# Patient Record
Sex: Female | Born: 1975 | Race: White | Hispanic: No | Marital: Married | Smoking: Never smoker
Health system: Southern US, Community
[De-identification: ages and names within clinical notes are randomized; demographics above are authoritative.]

## PROBLEM LIST (undated history)

## (undated) DIAGNOSIS — E039 Hypothyroidism, unspecified: Secondary | ICD-10-CM

## (undated) DIAGNOSIS — E785 Hyperlipidemia, unspecified: Secondary | ICD-10-CM

## (undated) HISTORY — PX: ABDOMINAL HYSTERECTOMY: SHX81

## (undated) HISTORY — DX: Hyperlipidemia, unspecified: E78.5

## (undated) HISTORY — DX: Hypothyroidism, unspecified: E03.9

---

## 2011-01-19 ENCOUNTER — Ambulatory Visit: Payer: Self-pay

## 2012-01-24 ENCOUNTER — Ambulatory Visit: Payer: Self-pay | Admitting: General Practice

## 2015-04-15 ENCOUNTER — Other Ambulatory Visit: Payer: Self-pay | Admitting: Medical

## 2015-04-15 DIAGNOSIS — E039 Hypothyroidism, unspecified: Secondary | ICD-10-CM

## 2015-04-20 ENCOUNTER — Ambulatory Visit
Admission: RE | Admit: 2015-04-20 | Discharge: 2015-04-20 | Disposition: A | Discharge status: Home / Self Care | Payer: BLUE CROSS/BLUE SHIELD | Source: Ambulatory Visit | Attending: Medical | Admitting: Medical

## 2015-04-20 ENCOUNTER — Ambulatory Visit: Payer: BLUE CROSS/BLUE SHIELD

## 2015-04-20 DIAGNOSIS — R5383 Other fatigue: Secondary | ICD-10-CM | POA: Diagnosis present

## 2015-04-20 DIAGNOSIS — E039 Hypothyroidism, unspecified: Secondary | ICD-10-CM

## 2017-04-05 ENCOUNTER — Other Ambulatory Visit: Payer: Self-pay

## 2017-04-05 DIAGNOSIS — E559 Vitamin D deficiency, unspecified: Secondary | ICD-10-CM

## 2017-04-05 DIAGNOSIS — E7800 Pure hypercholesterolemia, unspecified: Secondary | ICD-10-CM

## 2017-04-05 DIAGNOSIS — E039 Hypothyroidism, unspecified: Secondary | ICD-10-CM

## 2017-04-06 LAB — VITAMIN D 25 HYDROXY (VIT D DEFICIENCY, FRACTURES): Vit D, 25-Hydroxy: 25.1 ng/mL — ABNORMAL LOW (ref 30.0–100.0)

## 2017-04-06 LAB — LIPID PANEL
CHOL/HDL RATIO: 4.2 ratio (ref 0.0–4.4)
Cholesterol, Total: 219 mg/dL — ABNORMAL HIGH (ref 100–199)
HDL: 52 mg/dL (ref >39)
LDL Calculated: 143 mg/dL — ABNORMAL HIGH (ref 0–99)
Triglycerides: 121 mg/dL (ref 0–149)
VLDL Cholesterol Cal: 24 mg/dL (ref 5–40)

## 2017-04-06 LAB — TSH: TSH: 3.22 u[IU]/mL (ref 0.450–4.500)

## 2017-05-11 ENCOUNTER — Other Ambulatory Visit: Payer: Self-pay | Admitting: Obstetrics and Gynecology

## 2017-05-11 DIAGNOSIS — Z1231 Encounter for screening mammogram for malignant neoplasm of breast: Secondary | ICD-10-CM

## 2017-05-31 ENCOUNTER — Encounter (HOSPITAL_COMMUNITY): Payer: Self-pay

## 2017-05-31 ENCOUNTER — Ambulatory Visit
Admission: RE | Admit: 2017-05-31 | Discharge: 2017-05-31 | Disposition: A | Discharge status: Home / Self Care | Payer: BLUE CROSS/BLUE SHIELD | Source: Ambulatory Visit | Attending: Obstetrics and Gynecology | Admitting: Obstetrics and Gynecology

## 2017-05-31 DIAGNOSIS — Z1231 Encounter for screening mammogram for malignant neoplasm of breast: Secondary | ICD-10-CM | POA: Diagnosis present

## 2017-06-13 ENCOUNTER — Other Ambulatory Visit: Payer: Self-pay

## 2017-06-13 DIAGNOSIS — E039 Hypothyroidism, unspecified: Secondary | ICD-10-CM

## 2017-06-14 LAB — TSH: TSH: 1.56 u[IU]/mL (ref 0.450–4.500)

## 2017-10-16 ENCOUNTER — Other Ambulatory Visit: Payer: Self-pay

## 2017-10-16 DIAGNOSIS — Z0000 Encounter for general adult medical examination without abnormal findings: Secondary | ICD-10-CM

## 2017-10-17 LAB — COMPREHENSIVE METABOLIC PANEL
ALT: 8 IU/L (ref 0–32)
AST: 14 IU/L (ref 0–40)
Albumin/Globulin Ratio: 1.6 (ref 1.2–2.2)
Albumin: 4.1 g/dL (ref 3.5–5.5)
Alkaline Phosphatase: 44 IU/L (ref 39–117)
BUN/Creatinine Ratio: 15 (ref 9–23)
BUN: 13 mg/dL (ref 6–24)
Bilirubin Total: 0.4 mg/dL (ref 0.0–1.2)
CO2: 24 mmol/L (ref 20–29)
Calcium: 9 mg/dL (ref 8.7–10.2)
Chloride: 103 mmol/L (ref 96–106)
Creatinine, Ser: 0.88 mg/dL (ref 0.57–1.00)
GFR calc Af Amer: 93 mL/min/{1.73_m2} (ref >59)
GFR calc non Af Amer: 81 mL/min/{1.73_m2} (ref >59)
Globulin, Total: 2.6 g/dL (ref 1.5–4.5)
Glucose: 93 mg/dL (ref 65–99)
Potassium: 5.2 mmol/L (ref 3.5–5.2)
Sodium: 138 mmol/L (ref 134–144)
Total Protein: 6.7 g/dL (ref 6.0–8.5)

## 2017-10-17 LAB — LIPID PANEL
Chol/HDL Ratio: 3.8 ratio (ref 0.0–4.4)
Cholesterol, Total: 195 mg/dL (ref 100–199)
HDL: 52 mg/dL (ref >39)
LDL Calculated: 125 mg/dL — ABNORMAL HIGH (ref 0–99)
Triglycerides: 90 mg/dL (ref 0–149)
VLDL Cholesterol Cal: 18 mg/dL (ref 5–40)

## 2017-10-17 LAB — CBC WITH DIFFERENTIAL/PLATELET
Basophils Absolute: 0.1 10*3/uL (ref 0.0–0.2)
Basos: 1 %
EOS (ABSOLUTE): 0.1 10*3/uL (ref 0.0–0.4)
Eos: 1 %
Hematocrit: 34.9 % (ref 34.0–46.6)
Hemoglobin: 11.1 g/dL (ref 11.1–15.9)
Immature Grans (Abs): 0 10*3/uL (ref 0.0–0.1)
Immature Granulocytes: 0 %
Lymphocytes Absolute: 1.6 10*3/uL (ref 0.7–3.1)
Lymphs: 33 %
MCH: 29.8 pg (ref 26.6–33.0)
MCHC: 31.8 g/dL (ref 31.5–35.7)
MCV: 94 fL (ref 79–97)
Monocytes Absolute: 0.2 10*3/uL (ref 0.1–0.9)
Monocytes: 5 %
Neutrophils Absolute: 2.9 10*3/uL (ref 1.4–7.0)
Neutrophils: 60 %
Platelets: 255 10*3/uL (ref 150–379)
RBC: 3.73 x10E6/uL — ABNORMAL LOW (ref 3.77–5.28)
RDW: 13.8 % (ref 12.3–15.4)
WBC: 4.8 10*3/uL (ref 3.4–10.8)

## 2017-10-17 LAB — TSH: TSH: 2.88 u[IU]/mL (ref 0.450–4.500)

## 2017-10-17 LAB — VITAMIN D 25 HYDROXY (VIT D DEFICIENCY, FRACTURES): Vit D, 25-Hydroxy: 52.8 ng/mL (ref 30.0–100.0)

## 2017-10-17 LAB — HGB A1C W/O EAG: Hgb A1c MFr Bld: 5.1 % (ref 4.8–5.6)

## 2017-11-03 ENCOUNTER — Ambulatory Visit: Payer: Self-pay | Admitting: Adult Health

## 2017-11-03 ENCOUNTER — Other Ambulatory Visit: Payer: Self-pay

## 2017-11-03 ENCOUNTER — Encounter: Payer: Self-pay | Admitting: Adult Health

## 2017-11-03 VITALS — BP 108/70 | HR 62 | Resp 16 | Ht 60.0 in | Wt 187.0 lb

## 2017-11-03 DIAGNOSIS — Z7184 Encounter for health counseling related to travel: Secondary | ICD-10-CM

## 2017-11-03 MED ORDER — CIPROFLOXACIN HCL 500 MG PO TABS: 500.0000 mg | ORAL_TABLET | Freq: Two times a day (BID) | ORAL | 0 refills | Status: AC

## 2017-11-03 MED ORDER — ATOVAQUONE-PROGUANIL HCL 250-100 MG PO TABS: 1.0000 | ORAL_TABLET | Freq: Every day | ORAL | 0 refills | Status: DC

## 2017-11-03 NOTE — Progress Notes (Addendum)
Subjective:     Patient ID: Sandra Reynolds, female   DOB: Jul 01, 1976, 41 y.o.   MRN: [id number]  HPI  Blood pressure 108/70, pulse 62, resp. rate 16, height 5' (1.524 m), weight 187 lb (84.8 kg), SpO2 98 %.  Patient is a 41 year old female in no acute distress who comes to the clinic for travel information. Sh leaves for [place] for three weeks on January 2nd 2019. She reports she is healthy - denies any medical problems or recent infections. She denies being immunocompromised. She does have uterine fibroids and is being followed by her gynecologist.   Patient leaving January 2nd and returns in February and March as well as June.   Patient has documentation she has completed Hepatits A vaccine - 2 dose series and a booster in 2005. Hepatitis B vaccine completed x 3 doses as well as she had a booster 2005.   She questions if she has immunity - can check immunity on Hepatitis B. No test recomended on Hepatitis  A per CDC guideline and no booster reccomended.   She has documentation of taking Typhoid capsules in December 2014 - ( per CDC guidelines this is good for 5 years which would be December 2019.  She reports she had influenza vaccine this year 2018.  Last TDAP June 2011 - needs repeat 2021.   Patient's last menstrual period was 10/06/2017 (exact date).   There are no active problems to display for this patient.   Current Outpatient Medications:  .  levothyroxine (SYNTHROID, LEVOTHROID) 112 MCG tablet, Take 112 mcg by mouth daily before breakfast., Disp: , Rfl:  .  atovaquone-proguanil (MALARONE) 250-100 MG TABS tablet, Take by mouth., Disp: , Rfl:  .  Calcium Carbonate-Vitamin D 600-400 MG-UNIT tablet, Take by mouth., Disp: , Rfl:  .  Vitamin D, Ergocalciferol, (DRISDOL) 50000 units CAPS capsule, TK ONE C PO ONCE A WEEK, Disp: , Rfl: 0     Review of Systems  Constitutional: Negative.   HENT: Negative.   Eyes: Negative.   Respiratory: Negative.   Cardiovascular: Negative.    Gastrointestinal: Negative.   Genitourinary: Negative.   Musculoskeletal: Negative.   Skin: Negative.   Allergic/Immunologic: Negative.   Neurological: Negative.   Hematological: Negative.   Psychiatric/Behavioral: Negative.      Objective:   Physical Exam  Constitutional: She is oriented to person, place, and time. She appears well-developed and well-nourished. No distress.  HENT:  Head: Normocephalic and atraumatic.  Left Ear: External ear normal.  Nose: Nose normal.  Mouth/Throat: Oropharynx is clear and moist. No oropharyngeal exudate.  Eyes: Conjunctivae and EOM are normal. Pupils are equal, round, and reactive to light. Right eye exhibits no discharge. Left eye exhibits no discharge. No scleral icterus.  Neck: Normal range of motion. Neck supple. No JVD present. No tracheal deviation present. No thyromegaly present.  Cardiovascular: Normal rate, regular rhythm, normal heart sounds and intact distal pulses. Exam reveals no gallop and no friction rub.  No murmur heard. Pulmonary/Chest: Effort normal and breath sounds normal. No stridor. No respiratory distress. She has no wheezes. She has no rales. She exhibits no tenderness.  Abdominal: Soft. Bowel sounds are normal. She exhibits no distension and no mass. There is no tenderness. There is no rebound and no guarding.  Musculoskeletal: Normal range of motion. She exhibits no edema or tenderness.  Lymphadenopathy:    She has no cervical adenopathy.  Neurological: She is alert and oriented to person, place, and time. She has normal reflexes.  She displays normal reflexes. No cranial nerve deficit. She exhibits normal muscle tone. Coordination normal.  Skin: Skin is warm and dry. No rash noted. She is not diaphoretic. No erythema. No pallor.  Psychiatric: She has a normal mood and affect. Her behavior is normal. Judgment and thought content normal.  Nursing note and vitals reviewed.      Assessment:     Travel advice encounter -  Plan: Hepatitis B Surface AntiBODY, CANCELED: Hepatitis B surface antibody, CANCELED: Hepatitis A antibody, IgM, CANCELED: Hepatitis A antibody, IgM, CANCELED: Hepatitis B surface antibody   Patient denies any history of liver or kidney disease.     Plan:      Meds ordered this encounter  Medications  . atovaquone-proguanil (MALARONE) 250-100 MG TABS tablet    Sig: Take 1 tablet by mouth daily. <    Dispense:  35 tablet    Refill:  0  . ciprofloxacin (CIPRO) 500 MG tablet    Sig: Take 1 tablet (500 mg total) by mouth 2 (two) times daily for 3 days. For travelers Diarrhea if occurs seek medical attention    Dispense:  6 tablet    Refill:  0   She is advised per CDC she needs to call the travel clinic - information on clinic given with contact number and address  for an expert consultation on travel and recomendations for travel to [place] - including but not limited to  cholera, japenese encephalitis, and yellow fever.   Will give Malaria prevention- she denies any complications with Malarone previously. She is instructed to  start 1-2 days prior to trip and continue through to 7 days post trip and seek medical attention for any side effects. - She will need to see travel clinic if she plans to return again within the next 6 months for medications as needed.   Travelers Diarrhea medication will be prescribed.   For all other care for this upcoming trip she will need to see Travel Clinic as advised.  Discussed with Dr. Julieanne Mansonichard Gilbert who is in agreement as well with the above plan   Handout also given on iron rich food per request of patient.   Follow up consult before 1- 2 weeks  with Travel Clinic advised- [facility] and telephone [facility] to [facility]  . Patient verbalized understanding of all instructions given and denies any further questions at this time.    Provider thoroughly discussed in collaboration above plan with supervising physician Dr. Julieanne Mansonichard Gilbert who is  in agreement with the care plan as above.

## 2017-11-03 NOTE — Patient Instructions (Addendum)
Hepatitis B Hepatitis B is a viral infection of the liver. There are two kinds of hepatitis B:  Acute hepatitis B. This lasts for six months or less.  Chronic hepatitis B. This lasts for more than six months. Chronic hepatitis B can lead to liver failure, scarring of the liver (cirrhosis), or liver cancer.  Acute hepatitis B can turn into chronic hepatitis B. Most adults with acute hepatitis B do not develop chronic hepatitis B. Infants and young children who get hepatitis B are more likely to develop chronic hepatitis B than adults. The hepatitis B vaccine can prevent this condition. What are the causes? This condition is caused by the hepatitis B virus (HBV). The virus may spread from person to person (is contagious) through:  Blood.  Childbirth. A woman who has hepatitis B can pass it to her baby during birth.  Bodily fluids such as breast milk, tears, semen, vaginal fluids, and saliva.  What increases the risk? The following factors may make you more likely to develop this condition:  Having contact with unclean (contaminated) needles or syringes. This may result from: ? Acupuncture. ? Tattooing. ? Body piercing. ? Injecting drugs.  Having unprotected sex with someone who is infected.  Living with or having close contact with a person who has hepatitis B.  Working in a job that involves contact with blood or bodily fluids, such as health care.  Traveling to a country that has many cases of hepatitis B.  Being on treatment to filter your blood (kidney dialysis).  Having a history of blood transfusions or organ transplants.  What are the signs or symptoms? Symptoms of this condition may include:  Loss of appetite.  Fatigue.  Nausea.  Vomiting.  Stomach pain.  Dark yellow urine.  Yellowish skin and eyes (jaundice).  Fever.  Light-colored or gray bowel movements.  Joint pain.  In some cases, you may not have any symptoms. How is this diagnosed? This  condition is diagnosed based on:  A physical exam.  Your medical history.  Blood tests.  How is this treated? Treatment for chronic hepatitis B may include antiviral medicine. This medicine may help:  Lower your risk of liver failure, cirrhosis, or liver cancer.  Lower your ability to infect others with hepatitis B.  You will need to avoid alcohol and medicines that can be hard for the liver to break down (metabolize). This helps prevent further injury to your liver. Follow these instructions at home: Medicines  Take over-the-counter and prescription medicines only as told by your health care provider.  Take your antiviral medicine as told by your health care provider. Do not stop taking the antiviral even if you start to feel better.  Do not take any over-the-counter medicines that contain acetaminophen.  Do not take any new medicines, including over-the-counter medicines for fever or pain, unless approved by your health care provider. Activity  Rest as needed.  Do not have sex unless approved by your health care provider.  Ask your health care provider when you may return to school or work. Eating and drinking  Eat a balanced diet with plenty of fruits and vegetables, whole grains, and lowfat (lean) meats or other non-meat proteins (such as beans or tofu).  Drink enough fluids to keep your urine clear or pale yellow.  Avoid alcohol. General instructions  Do not share toothbrushes, nail clippers, or razors.  Wash your hands frequently with soap and water. If soap and water are not available, use hand sanitizer.  Keep all follow-up visits as told by your health care provider. This is important. How is this prevented?  Get the hepatitis B vaccine. This helps prevent the hepatitis B infection.  Wash your hands frequently with soap and water. If soap and water are not available, use hand sanitizer.  Do not share needles or syringes.  Practice safe sex and use  condoms.  Avoid handling blood or bodily fluids without gloves or other protection.  Avoid getting tattoos or piercings in shops or other locations that are not clean. Contact a health care provider if:  You develop a rash.  You develop jaundice, or your chronic jaundice becomes more severe.  You have a fever. Get help right away if:  You are unable to eat or drink.  You have a fever along with nausea or vomiting.  You feel confused.  You have trouble breathing.  Your skin, throat, mouth, or face becomes swollen.  You have jerky movements that you cannot control (seizure).  You become very sleepy or have trouble waking up.  Your stomach becomes very swollen. Summary  Hepatitis B is a viral infection of the liver. There are two kinds of hepatitis B: acute and chronic.  The hepatitis B virus (HBV) can be passed from person to person (is contagious).  You should not take any new medicines, including over-the-counter medicines for fever or pain, unless approved by your health care provider.  To help prevent hepatitis B, wash your hands frequently with soap and water. If soap and water are not available, use hand sanitizer. This information is not intended to replace advice given to you by your health care provider. Make sure you discuss any questions you have with your health care provider. Document Released: 10/28/2000 Document Revised: 12/06/2016 Document Reviewed: 12/06/2016 Elsevier Interactive Patient Education  2018 [employer]. Hepatitis A Hepatitis A is a viral infection of the liver. The virus causes inflammation in the liver and it can be passed from person to person (is contagious). Most cases of hepatitis A are fairly mild and people recover fully. The hepatitis A vaccine can prevent this condition. What are the causes? This condition is caused by the hepatitis A virus (HAV). The virus may be spread by:  Drinking or eating unclean (contaminated) food or  water.  Having sex with with someone who is infected.  Coming into contact with the stool (feces) of a person who is infected and passing the virus from your hands to your mouth.  What increases the risk? The following factors make you more likely to develop this condition:  Having contact with contaminated needles or syringes. This may happen during: ? Acupuncture. ? Tattooing. ? Body piercing. ? Injecting drugs.  Not having access to clean water or food.  Working at a day care or nursing home. Working in these facilities increases the risk of getting this infection because you are in contact with feces during diaper changes or general hygiene practices.  Having HIV (human immunodeficiency virus) or AIDS (acquired immunodeficiency syndrome).  Living in or traveling to countries where hepatitis A is common.  Being a man who has sex with men.  Having oral or anal sex.  Having hemophilia or another blood clotting factor disorder.  Having long-term (chronic) liver disease.  What are the signs or symptoms? Symptoms of this condition include:  Loss of appetite.  Fatigue.  Nausea.  Vomiting.  Stomach pain.  Dark yellow urine.  Yellowing of the skin and eyes (jaundice).  Fever.  Itchy skin.  Light-colored bowel movements.  Joint pain.  In some cases, you may not have any symptoms. How is this diagnosed? This condition is diagnosed based on:  A physical exam.  Your medical history.  Blood tests.  How is this treated? This condition usually goes away on its own over several weeks or months. There is no specific treatment for the disease after the virus has caused the infection. Severe cases of hepatitis A may require hospitalization to treat dehydration and to monitor liver function, but this is rare. Follow these instructions at home: Medicines  Take over-the-counter and prescription medicines only as told by your health care provider.  Do not take  over-the-counter medicines that contain acetaminophen.  Do not take any new medicines, including over-the-counter medicines and supplements, unless approved by your health care provider. Lifestyle  Rest. Make sure you: ? Get plenty of sleep. Avoid staying up late. ? Keep the same bedtime hours on weekends and weekdays. ? Take daytime naps or rest breaks when you feel tired.  Do not have sex unless approved by your health care provider.  Eat a balanced diet with plenty of fruits and vegetables, whole grains, and low-fat (lean) meats or other non-meat proteins (such as beans or tofu).  Do not drink alcohol until your health care provider approves. General instructions  Wash your hands frequently with soap and water, especially after using the bathroom, after changing diapers, and before handling food or water. If soap and water are not available, use hand sanitizer.  Tell your health care provider about all of the people you live with or with whom you have close contact. Your health care provider may recommend that they receive the hepatitis A vaccine.  Follow your health care provider's instructions about how to avoid spreading the virus.  Ask your health care provider when you may return to school or work.  Keep all follow-up visits as told by your health care provider. This is important. How is this prevented?  Get the hepatitis A vaccine. This helps prevent the hepatitis A infection.  If you have been recently exposed to hepatitis A, your health care provider may recommend that you get a shot of human immunoglobulin or the hepatitis A vaccine. This may help you prevent hepatitis A.  Wash your hands frequently with soap and water, especially after using the bathroom or changing diapers, and before handling food or water. If soap and water are not available, use hand sanitizer.  If you travel to a developing country: ? Avoid raw or under-cooked food. ? Drink bottled water  only. ? Use bottled water to brush your teeth, make ice cubes, and wash fruits and vegetables.  Practice safe sex. Always use condoms when having oral, vaginal, or anal sex. Contact a health care provider if:  You have a fever.  Your symptoms get worse. Get help right away if:  You are unable to eat or drink.  You cannot eat or drink without vomiting.  You feel confused.  Your jaundice gets worse.  You are very sleepy or have trouble waking up.  You have uncontrolled bleeding or bruising. Summary  Hepatitis A is a viral infection of the liver. The virus causes inflammation in the liver and it can be passed from person to person (is contagious).  The hepatitis A virus (HAV) can be spread by drinking or eating unclean (contaminated) food or water.  You should not take any new medicines, including over-the-counter medicines and supplements, unless they  are approved by your health care provider.  To help prevent hepatitis A, wash your hands frequently with soap and water, especially after using the bathroom or changing diapers, and before handling food or water. If soap and water are not available, use hand sanitizer. This information is not intended to replace advice given to you by your health care provider. Make sure you discuss any questions you have with your health care provider. Document Released: 10/28/2000 Document Revised: 12/06/2016 Document Reviewed: 12/06/2016 Elsevier Interactive Patient Education  2018 [employer]. Hepatitis A Antibody Test Why am I having this test? The hepatitis A antibody test looks for hepatitis A antibodies. Hepatitis A antibodies are infection-fighting proteins produced by your body's defense (immune) system in response to infection with the hepatitis A virus (HAV). These antibodies are also produced after you have received the hepatitis A vaccine. Different types of HAV antibodies may be present in your blood, depending on how long it has been  since infection or vaccination occurred. The different kinds of antibodies include:  IgM antibodies. These appear first.  IgG antibodies. These appear later.  What kind of sample is taken? A blood sample is required for this test. It is usually collected by inserting a needle into a vein. This test may be done:  If there is concern that you have been exposed to hepatitis A.  To diagnose hepatitis A infection.  If you have long-term (chronic) liver disease or abnormal liver function test results. This test may help to find the cause.  To see if you are already protected from (immune to) hepatitis A.  To see if you need the hepatitis A vaccine to protect you from future infection.  How do I prepare for this test? No preparations or fasting is required for this test. What do the results mean? The test results are based on which antibodies your blood has (is positive for) and which antibodies your blood does not have (is negative for). Meaning of Negative Test Results The test result is normal if your blood is negative for all types of hepatitis A antibodies except the ones that show that you are protected by the HAV vaccine. Be aware, however, that the test could incorrectly indicate that your blood is negative for those antibodies (a false negative result) even if infection is present. This is because it can take up to four weeks from the time of infection until hepatitis A IgM antibodies appear in your blood. If your health care provider suspects you have hepatitis A infection, you may be asked to repeat the test. Meaning of Combined Negative and Positive Test Results The following hepatitis A antibody test results may indicate several health conditions. Possible test result combinations include:  Positive for HAV IgM antibodies but negative for HAV IgG antibodies. This means you have an active hepatitis A infection.  Negative for HAV IgM antibodies but positive for HAV IgG antibodies.  This means one of these is true for you: ? You have long-term (chronic) HAV infection. ? You were exposed to HAV in the past but no longer have an active infection. ? You have received protection against HAV from HAV vaccination.  It is your responsibility to obtain your test results. Ask the lab or department performing the test when and how you will get your results. Talk with your health care provider if you have any questions about your results. Discuss your test results with your health care provider. He or she will use the results to make  a diagnosis and determine a treatment plan that is right for you. Talk with your health care provider to discuss your results, treatment options, and if necessary, the need for more tests. Talk with your health care provider if you have any questions about your results. This information is not intended to replace advice given to you by your health care provider. Make sure you discuss any questions you have with your health care provider. Document Released: 11/25/2004 Document Revised: 07/06/2016 Document Reviewed: 03/17/2014 Elsevier Interactive Patient Education  2018 [employer]. Hepatitis B Antigen and Antibody Tests Why am I having this test? Testing for hepatitis B virus (HBV) can include checking your blood for infection by the virus (antigen) itself or for hepatitis B antibodies. Hepatitis B antibodies are infection-fighting proteins produced by your body's defense (immune) system in response to an infection with HBV. These antibodies are also produced after you have received the hepatitis B vaccine. Different types of HBV antibodies may be present in your blood, depending on how long it has been since infection or vaccination occurred. Different types of HBV antigens can also be detected. The hepatitis B antigen and antibody tests detect the presence of these antigens or antibodies. These tests may be done:  If there is concern that you have been  exposed to hepatitis B.  To diagnose hepatitis B infection.  If you have long-term (chronic) liver disease or abnormal liver function test results. This test may help to find the cause.  To see if you are already protected from (immune to) hepatitis B.  To see if you need the hepatitis B vaccine to protect you from future infection.  What kind of sample is taken? A blood sample is required for the hepatitis B antigen and antibody tests. It is usually collected by inserting a needle into a vein. How do I prepare for this test? There is no preparation required for this test. What do the results mean? It is your responsibility to obtain your test results. Ask the lab or department performing the test when and how you will get your results. Talk with your health care provider if you have any questions about your results. The test results are based on which antibodies your blood has (is positive for) and which antibodies your blood does not have (is negative for). The test results are also based on whether hepatitis B antigen molecules are found in your blood. Normal Test Results The test result is normal if it matches one of these descriptions:  Negative HBsAg and HBeAg. This means no HBV antigen is present in your blood.  Positive HBsAb. This means you do not have an active infection but that you are protected from HBV from past exposure or from vaccination.  Abnormal Test Results The test result is abnormal and may indicate current or recent HBV infection if it matches one of these descriptions:  Positive HBeAg and HBsAg. This means you have a current HBV infection.  Positive HBVc-Ab/IgM or HBVc-Ab total. This means you have a current, previous, or long-term HBV infection.  Discuss your test results with your health care provider. He or she will use the results to make a diagnosis and determine a treatment plan that is right for you. Talk with your health care provider to discuss your  results, treatment options, and if necessary, the need for more tests. Talk with your health care provider if you have any questions about your results. This information is not intended to replace advice given to  you by your health care provider. Make sure you discuss any questions you have with your health care provider. Document Released: 11/25/2004 Document Revised: 07/06/2016 Document Reviewed: 03/18/2014 Elsevier Interactive Patient Education  2018 [employer]. Iron-Rich Diet Iron is a mineral that helps your body to produce hemoglobin. Hemoglobin is a protein in your red blood cells that carries oxygen to your body's tissues. Eating too little iron may cause you to feel weak and tired, and it can increase your risk for infection. Eating enough iron is necessary for your body's metabolism, muscle function, and nervous system. Iron is naturally found in many foods. It can also be added to foods or fortified in foods. There are two types of dietary iron:  Heme iron. Heme iron is absorbed by the body more easily than nonheme iron. Heme iron is found in meat, poultry, and fish.  Nonheme iron. Nonheme iron is found in dietary supplements, iron-fortified grains, beans, and vegetables.  You may need to follow an iron-rich diet if:  You have been diagnosed with iron deficiency or iron-deficiency anemia.  You have a condition that prevents you from absorbing dietary iron, such as: ? Infection in your intestines. ? Celiac disease. This involves long-lasting (chronic) inflammation of your intestines.  You do not eat enough iron.  You eat a diet that is high in foods that impair iron absorption.  You have lost a lot of blood.  You have heavy bleeding during your menstrual cycle.  You are pregnant.  What is my plan? Your health care provider may help you to determine how much iron you need per day based on your condition. Generally, when a person consumes sufficient amounts of iron in the  diet, the following iron needs are met:  Men. ? 9-58 years old: 11 mg per day. ? 98-48 years old: 8 mg per day.  Women. ? 66-34 years old: 15 mg per day. ? 50-22 years old: 18 mg per day. ? Over 3 years old: 8 mg per day. ? Pregnant women: 27 mg per day. ? Breastfeeding women: 9 mg per day.  What do I need to know about an iron-rich diet?  Eat fresh fruits and vegetables that are high in vitamin C along with foods that are high in iron. This will help increase the amount of iron that your body absorbs from food, especially with foods containing nonheme iron. Foods that are high in vitamin C include oranges, peppers, tomatoes, and mango.  Take iron supplements only as directed by your health care provider. Overdose of iron can be life-threatening. If you were prescribed iron supplements, take them with orange juice or a vitamin C supplement.  Cook foods in pots and pans that are made from iron.  Eat nonheme iron-containing foods alongside foods that are high in heme iron. This helps to improve your iron absorption.  Certain foods and drinks contain compounds that impair iron absorption. Avoid eating these foods in the same meal as iron-rich foods or with iron supplements. These include: ? Coffee, black tea, and red wine. ? Milk, dairy products, and foods that are high in calcium. ? Beans, soybeans, and peas. ? Whole grains.  When eating foods that contain both nonheme iron and compounds that impair iron absorption, follow these tips to absorb iron better. ? Soak beans overnight before cooking. ? Soak whole grains overnight and drain them before using. ? Ferment flours before baking, such as using yeast in bread dough. What foods can I eat? Grains Iron-fortified  breakfast cereal. Iron-fortified whole-wheat bread. Enriched rice. Sprouted grains. Vegetables Spinach. Potatoes with skin. Green peas. Broccoli. Red and green bell peppers. Fermented vegetables. Fruits Prunes. Raisins.  Oranges. Strawberries. Mango. Grapefruit. Meats and Other Protein Sources Beef liver. Oysters. Beef. Shrimp. [place]. Chicken. Cucumber. Sardines. Chickpeas. Nuts. Tofu. Beverages Tomato juice. Fresh orange juice. Prune juice. Hibiscus tea. Fortified instant breakfast shakes. Condiments Tahini. Fermented soy sauce. Sweets and Desserts Black-strap molasses. Other Wheat germ. The items listed above may not be a complete list of recommended foods or beverages. Contact your dietitian for more options. What foods are not recommended? Grains Whole grains. Bran cereal. Bran flour. Oats. Vegetables Artichokes. Brussels sprouts. Kale. Fruits Blueberries. Raspberries. Strawberries. Figs. Meats and Other Protein Sources Soybeans. Products made from soy protein. Dairy Milk. Cream. Cheese. Yogurt. Cottage cheese. Beverages Coffee. Black tea. Red wine. Sweets and Desserts Cocoa. Chocolate. Ice cream. Other Basil. Oregano. Parsley. The items listed above may not be a complete list of foods and beverages to avoid. Contact your dietitian for more information. This information is not intended to replace advice given to you by your health care provider. Make sure you discuss any questions you have with your health care provider. Document Released: 06/14/2005 Document Revised: 05/20/2016 Document Reviewed: 05/28/2014 Elsevier Interactive Patient Education  [employer].

## 2017-11-04 LAB — HEPATITIS B SURFACE ANTIBODY, QUANTITATIVE: Hepatitis B Surf Ab Quant: >1000 m[IU]/mL (ref >9.9)

## 2017-11-09 NOTE — Progress Notes (Signed)
Hepatitis B result is consistent with immunity from Hepatis B series.

## 2017-11-17 ENCOUNTER — Telehealth: Payer: Self-pay

## 2017-12-18 ENCOUNTER — Telehealth: Payer: Self-pay

## 2017-12-18 ENCOUNTER — Ambulatory Visit: Payer: Self-pay | Admitting: Medical

## 2017-12-18 NOTE — Progress Notes (Addendum)
   Subjective:    Patient ID: Sandra Reynolds, female    DOB: 1976/10/15, 42 y.o.   MRN: [id number]  HPI Patient not seen on this date.phone call to patient by Joen LauraKathy Harrison.see notes.   Review of Systems     Objective:   Physical Exam        Assessment & Plan:

## 2017-12-18 NOTE — Telephone Encounter (Addendum)
Pt had scheduled appt this morning for lab (TSH) according to pt ordered by her PCP and follow up; pt states she was told to come back for travel f/u appt and lab draw; no order placed by PCP in computer; after reviewing documentation from 12/21 visit with Judie PetitM. Flinchum, NP it states "She is advised per CDC to call the travel clinic-information on clinic given with contact number and address for an expert consultation on travel and recommendations for travel to [place]" "Will give Malaria prevention...she will need to see travel clinic if she plans to return again within the next 6 months for medications as needed.""For all other care for this upcoming trip she will need to see Travel Clinic as advised. Discussed with Dr. Julieanne Mansonichard Gilbert who is in agreement as well with the above plan."  Both providers felt it was in the best interest and safety of the patient to seek expert consultation with Travel clinic prior [facility]; pt upset and state she has traveled many times before and doesn't understand why they just can't prescribe the medication; pt thought that the provider meant for her to go to travel clinic for Hep A and B immunity questions; apologized for the miscommunication; pt thanked me for following up; states she is leaving on 2/12 but will figure something out; again explained the instructions given from the 12/21 visit

## 2017-12-20 ENCOUNTER — Other Ambulatory Visit: Payer: Self-pay | Admitting: *Deleted

## 2017-12-20 ENCOUNTER — Telehealth: Payer: Self-pay | Admitting: *Deleted

## 2017-12-20 ENCOUNTER — Other Ambulatory Visit: Payer: Self-pay

## 2017-12-20 DIAGNOSIS — E039 Hypothyroidism, unspecified: Secondary | ICD-10-CM

## 2018-02-01 ENCOUNTER — Other Ambulatory Visit: Payer: Self-pay

## 2018-02-01 DIAGNOSIS — E039 Hypothyroidism, unspecified: Secondary | ICD-10-CM

## 2018-02-02 LAB — TSH: TSH: 0.787 u[IU]/mL (ref 0.450–4.500)

## 2018-04-17 ENCOUNTER — Other Ambulatory Visit: Payer: Self-pay | Admitting: Family Medicine

## 2018-04-17 DIAGNOSIS — Z1231 Encounter for screening mammogram for malignant neoplasm of breast: Secondary | ICD-10-CM

## 2018-07-12 ENCOUNTER — Ambulatory Visit
Admission: RE | Admit: 2018-07-12 | Discharge: 2018-07-12 | Disposition: A | Discharge status: Home / Self Care | Payer: BLUE CROSS/BLUE SHIELD | Source: Ambulatory Visit | Attending: Family Medicine | Admitting: Family Medicine

## 2018-07-12 DIAGNOSIS — Z1231 Encounter for screening mammogram for malignant neoplasm of breast: Secondary | ICD-10-CM

## 2018-10-17 ENCOUNTER — Other Ambulatory Visit: Payer: Self-pay

## 2018-10-17 DIAGNOSIS — E611 Iron deficiency: Secondary | ICD-10-CM

## 2018-10-17 DIAGNOSIS — E559 Vitamin D deficiency, unspecified: Secondary | ICD-10-CM

## 2018-10-17 DIAGNOSIS — E7800 Pure hypercholesterolemia, unspecified: Secondary | ICD-10-CM

## 2018-10-17 DIAGNOSIS — Z0000 Encounter for general adult medical examination without abnormal findings: Secondary | ICD-10-CM

## 2018-10-17 DIAGNOSIS — E039 Hypothyroidism, unspecified: Secondary | ICD-10-CM

## 2018-10-18 ENCOUNTER — Other Ambulatory Visit: Payer: Self-pay

## 2018-10-18 DIAGNOSIS — E559 Vitamin D deficiency, unspecified: Secondary | ICD-10-CM

## 2018-10-18 DIAGNOSIS — E7800 Pure hypercholesterolemia, unspecified: Secondary | ICD-10-CM

## 2018-10-18 DIAGNOSIS — E039 Hypothyroidism, unspecified: Secondary | ICD-10-CM

## 2018-10-18 DIAGNOSIS — E611 Iron deficiency: Secondary | ICD-10-CM

## 2018-10-18 DIAGNOSIS — Z0000 Encounter for general adult medical examination without abnormal findings: Secondary | ICD-10-CM

## 2018-10-19 LAB — COMPREHENSIVE METABOLIC PANEL
A/G RATIO: 1.6 (ref 1.2–2.2)
ALT: 20 IU/L (ref 0–32)
AST: 16 IU/L (ref 0–40)
Albumin: 4.2 g/dL (ref 3.5–5.5)
Alkaline Phosphatase: 33 IU/L — ABNORMAL LOW (ref 39–117)
BILIRUBIN TOTAL: 0.2 mg/dL (ref 0.0–1.2)
BUN/Creatinine Ratio: 13 (ref 9–23)
BUN: 13 mg/dL (ref 6–24)
CHLORIDE: 103 mmol/L (ref 96–106)
CO2: 21 mmol/L (ref 20–29)
Calcium: 9.1 mg/dL (ref 8.7–10.2)
Creatinine, Ser: 1 mg/dL (ref 0.57–1.00)
GFR calc Af Amer: 79 mL/min/{1.73_m2} (ref >59)
GFR calc non Af Amer: 69 mL/min/{1.73_m2} (ref >59)
GLOBULIN, TOTAL: 2.7 g/dL (ref 1.5–4.5)
Glucose: 99 mg/dL (ref 65–99)
POTASSIUM: 4.3 mmol/L (ref 3.5–5.2)
SODIUM: 138 mmol/L (ref 134–144)
Total Protein: 6.9 g/dL (ref 6.0–8.5)

## 2018-10-19 LAB — CBC WITH DIFFERENTIAL/PLATELET
BASOS: 1 %
Basophils Absolute: 0.1 10*3/uL (ref 0.0–0.2)
EOS (ABSOLUTE): 0.1 10*3/uL (ref 0.0–0.4)
Eos: 1 %
Hematocrit: 38.8 % (ref 34.0–46.6)
Hemoglobin: 12.5 g/dL (ref 11.1–15.9)
Immature Grans (Abs): 0 10*3/uL (ref 0.0–0.1)
Immature Granulocytes: 0 %
LYMPHS ABS: 1.9 10*3/uL (ref 0.7–3.1)
Lymphs: 33 %
MCH: 30.4 pg (ref 26.6–33.0)
MCHC: 32.2 g/dL (ref 31.5–35.7)
MCV: 94 fL (ref 79–97)
MONOS ABS: 0.3 10*3/uL (ref 0.1–0.9)
Monocytes: 5 %
NEUTROS ABS: 3.5 10*3/uL (ref 1.4–7.0)
Neutrophils: 60 %
Platelets: 278 10*3/uL (ref 150–450)
RBC: 4.11 x10E6/uL (ref 3.77–5.28)
RDW: 13.8 % (ref 12.3–15.4)
WBC: 5.7 10*3/uL (ref 3.4–10.8)

## 2018-10-19 LAB — VITAMIN D 25 HYDROXY (VIT D DEFICIENCY, FRACTURES): Vit D, 25-Hydroxy: 32.6 ng/mL (ref 30.0–100.0)

## 2018-10-19 LAB — LIPID PANEL
CHOL/HDL RATIO: 5.8 ratio — AB (ref 0.0–4.4)
Cholesterol, Total: 185 mg/dL (ref 100–199)
HDL: 32 mg/dL — ABNORMAL LOW (ref >39)
LDL CALC: 128 mg/dL — AB (ref 0–99)
Triglycerides: 126 mg/dL (ref 0–149)
VLDL Cholesterol Cal: 25 mg/dL (ref 5–40)

## 2018-10-19 LAB — IRON: IRON: 138 ug/dL (ref 27–159)

## 2018-10-19 LAB — TSH: TSH: 3.56 u[IU]/mL (ref 0.450–4.500)

## 2018-10-19 LAB — HGB A1C W/O EAG: HEMOGLOBIN A1C: 5.3 % (ref 4.8–5.6)

## 2018-12-25 ENCOUNTER — Other Ambulatory Visit: Payer: Self-pay

## 2018-12-25 DIAGNOSIS — E039 Hypothyroidism, unspecified: Secondary | ICD-10-CM

## 2018-12-26 LAB — TSH+FREE T4
Free T4: 1.44 ng/dL (ref 0.82–1.77)
TSH: 0.981 u[IU]/mL (ref 0.450–4.500)

## 2019-07-12 ENCOUNTER — Other Ambulatory Visit
Admission: RE | Admit: 2019-07-12 | Discharge: 2019-07-12 | Disposition: A | Discharge status: Home / Self Care | Payer: BC Managed Care – PPO | Source: Ambulatory Visit | Attending: Family Medicine | Admitting: Family Medicine

## 2019-07-12 DIAGNOSIS — R1031 Right lower quadrant pain: Secondary | ICD-10-CM | POA: Insufficient documentation

## 2019-07-12 DIAGNOSIS — R197 Diarrhea, unspecified: Secondary | ICD-10-CM | POA: Insufficient documentation

## 2019-07-12 DIAGNOSIS — R1032 Left lower quadrant pain: Secondary | ICD-10-CM | POA: Insufficient documentation

## 2019-07-12 LAB — GASTROINTESTINAL PANEL BY PCR, STOOL (REPLACES STOOL CULTURE)

## 2019-07-25 IMAGING — MG MM DIGITAL SCREENING BILAT W/ TOMO W/ CAD
8 series · 8 of 24 positions shown · non-contrast
Comparison: Previous exam(s).

CLINICAL DATA: Screening.

EXAM:
DIGITAL SCREENING BILATERAL MAMMOGRAM WITH TOMO AND CAD

[R CC synth-2D]
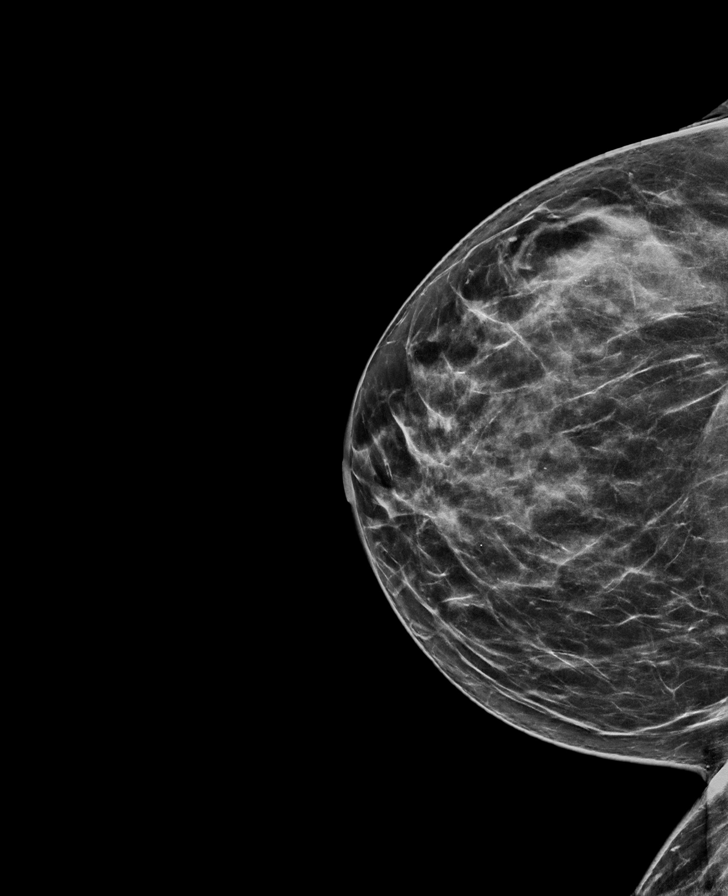

[L MLO synth-2D]
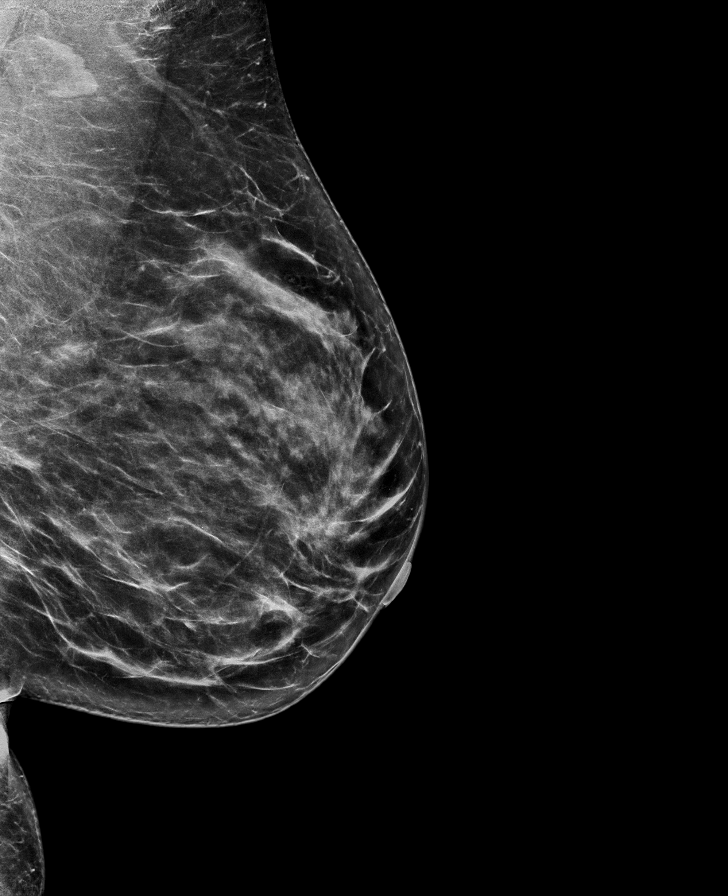

[L CC synth-2D]
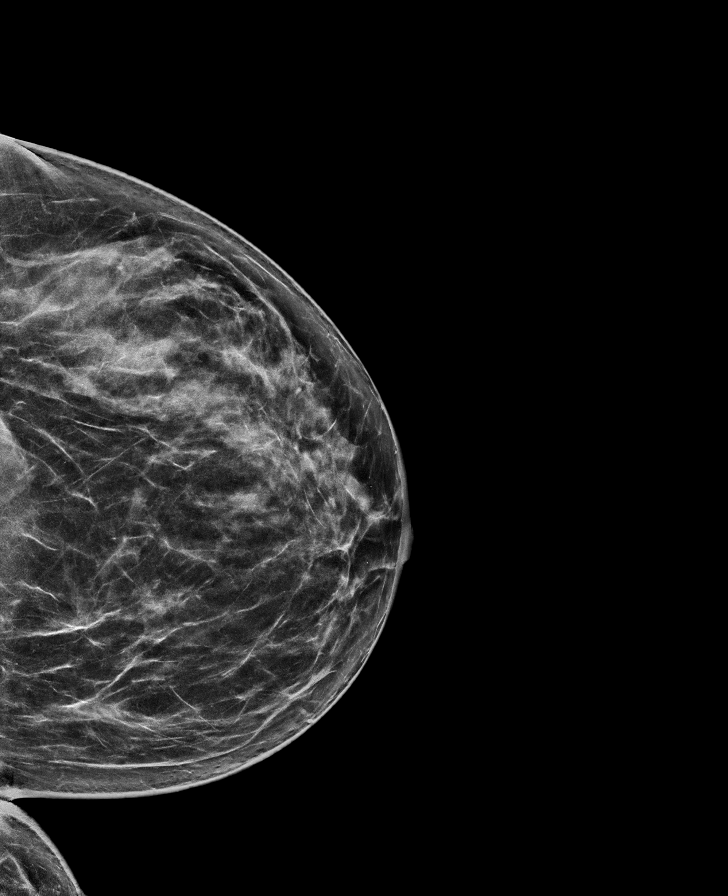

[R MLO synth-2D]
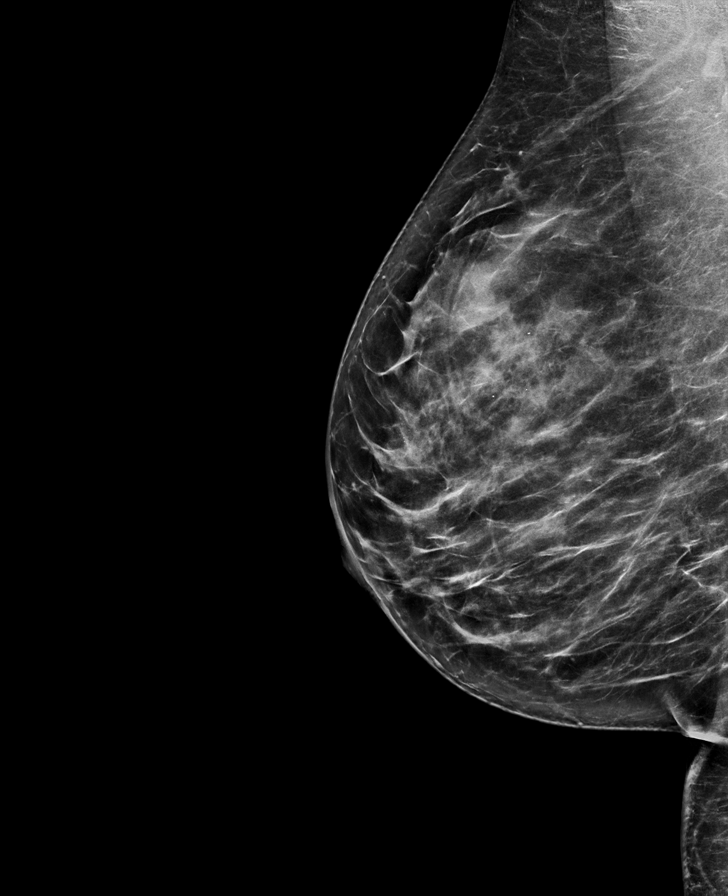

[R MLO tomo · tomo slice 38/75.0]
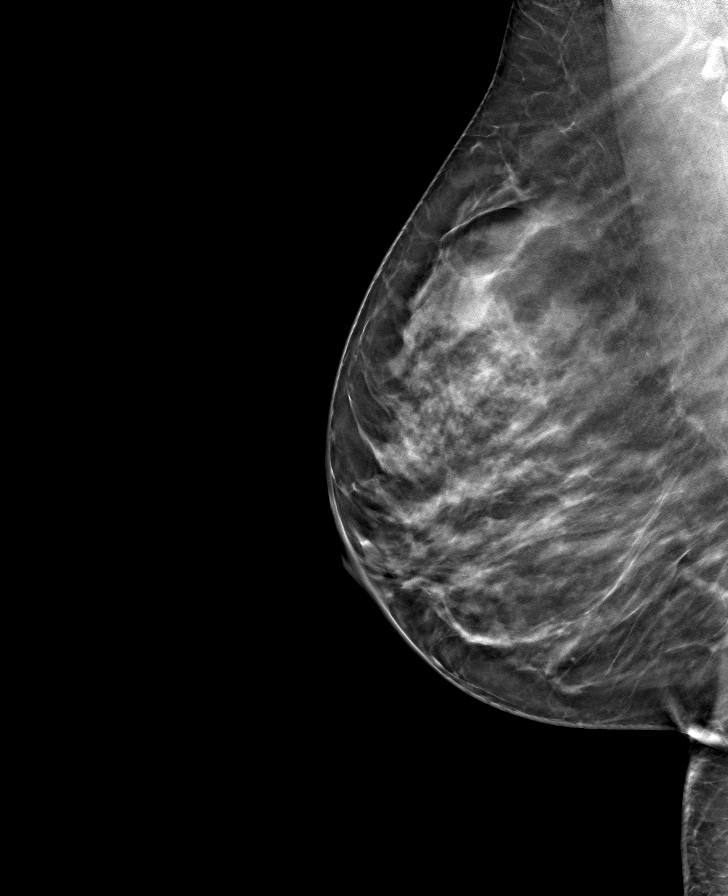

[L CC tomo · tomo slice 37/73.0]
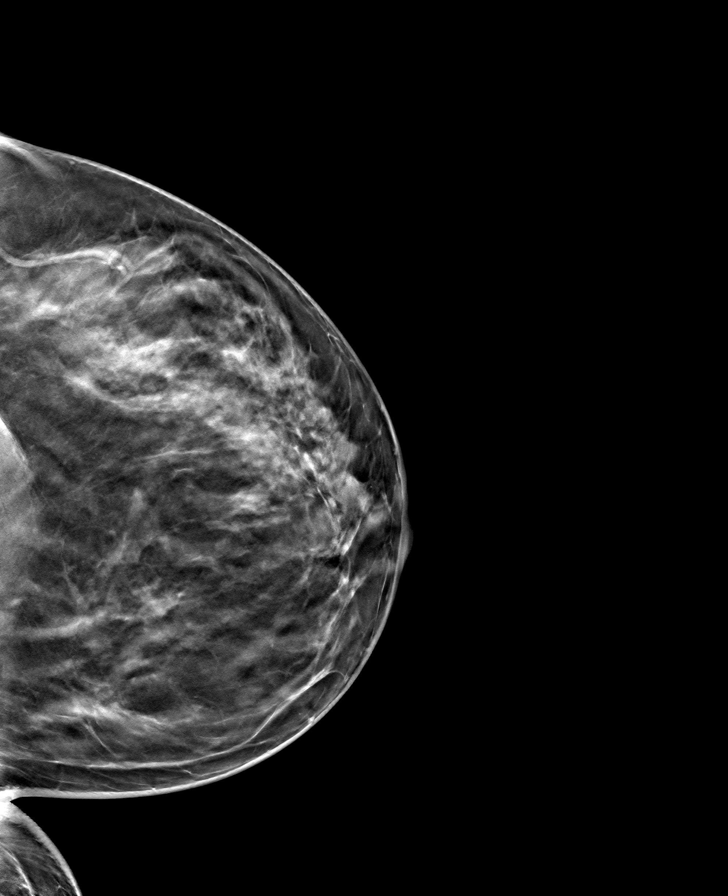

[L MLO tomo · tomo slice 38/75.0]
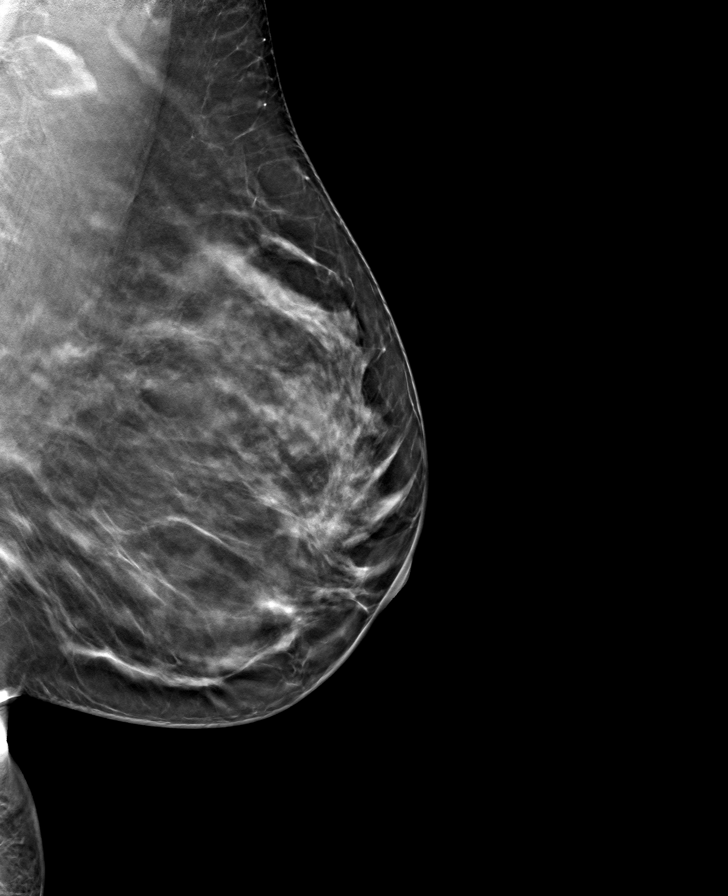

[R CC tomo · tomo slice 36/71.0]
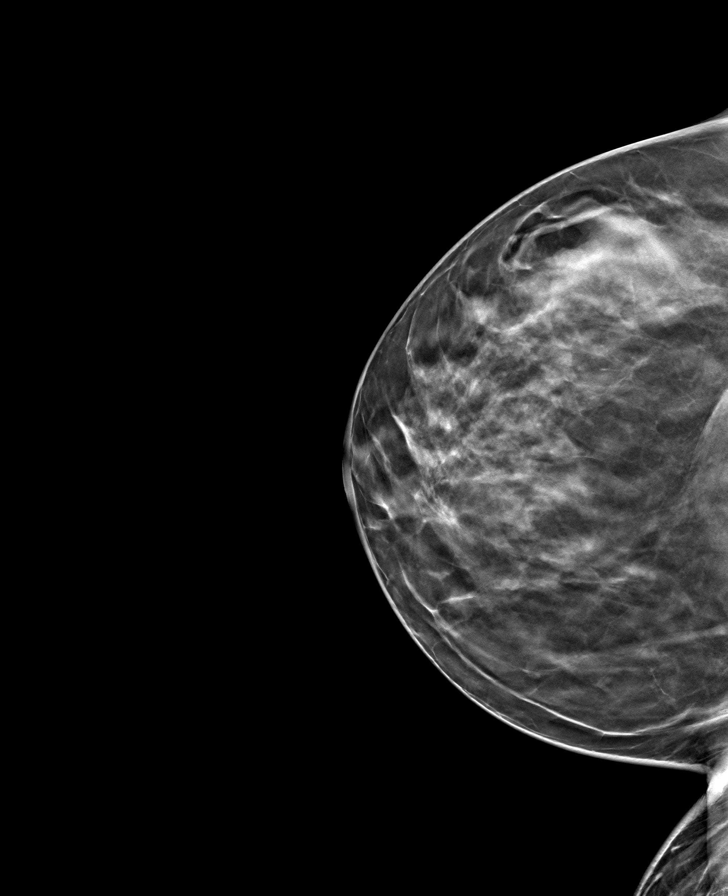

[8 of 24 positions shown; findings below may reference images not displayed]

ACR Breast Density Category c: The breast tissue is heterogeneously
dense, which may obscure small masses.
FINDINGS: There are no findings suspicious for malignancy. Images were
processed with CAD.
IMPRESSION: No mammographic evidence of malignancy. A result letter of this
screening mammogram will be mailed directly to the patient.

RECOMMENDATION:
Screening mammogram in one year. (Code:[id number])

BI-RADS CATEGORY  1: Negative.

## 2019-08-28 ENCOUNTER — Ambulatory Visit: Payer: Self-pay

## 2019-08-28 ENCOUNTER — Other Ambulatory Visit: Payer: Self-pay

## 2019-08-28 DIAGNOSIS — Z23 Encounter for immunization: Secondary | ICD-10-CM

## 2019-10-09 ENCOUNTER — Other Ambulatory Visit: Payer: Self-pay | Admitting: Family Medicine

## 2019-10-09 DIAGNOSIS — Z1231 Encounter for screening mammogram for malignant neoplasm of breast: Secondary | ICD-10-CM

## 2019-10-16 ENCOUNTER — Ambulatory Visit
Admission: RE | Admit: 2019-10-16 | Discharge: 2019-10-16 | Disposition: A | Discharge status: Home / Self Care | Payer: BC Managed Care – PPO | Source: Ambulatory Visit | Attending: Family Medicine | Admitting: Family Medicine

## 2019-10-16 DIAGNOSIS — Z1231 Encounter for screening mammogram for malignant neoplasm of breast: Secondary | ICD-10-CM | POA: Insufficient documentation

## 2020-10-23 ENCOUNTER — Other Ambulatory Visit: Payer: Self-pay | Admitting: Family Medicine

## 2020-10-23 DIAGNOSIS — Z1231 Encounter for screening mammogram for malignant neoplasm of breast: Secondary | ICD-10-CM

## 2020-11-09 ENCOUNTER — Other Ambulatory Visit: Payer: Self-pay

## 2020-11-09 ENCOUNTER — Ambulatory Visit: Payer: BC Managed Care – PPO

## 2020-11-09 DIAGNOSIS — Z20822 Contact with and (suspected) exposure to covid-19: Secondary | ICD-10-CM

## 2020-11-09 DIAGNOSIS — Z1152 Encounter for screening for COVID-19: Secondary | ICD-10-CM

## 2020-11-09 LAB — POC COVID19 BINAXNOW: SARS Coronavirus 2 Ag: NEGATIVE

## 2020-11-10 ENCOUNTER — Ambulatory Visit
Admission: RE | Admit: 2020-11-10 | Discharge: 2020-11-10 | Disposition: A | Discharge status: Home / Self Care | Payer: BC Managed Care – PPO | Source: Ambulatory Visit | Attending: Family Medicine | Admitting: Family Medicine

## 2020-11-10 DIAGNOSIS — Z1231 Encounter for screening mammogram for malignant neoplasm of breast: Secondary | ICD-10-CM

## 2020-11-11 LAB — NOVEL CORONAVIRUS, NAA: SARS-CoV-2, NAA: NOT DETECTED

## 2020-11-11 LAB — SARS-COV-2, NAA 2 DAY TAT

## 2021-05-12 ENCOUNTER — Other Ambulatory Visit: Payer: Self-pay | Admitting: Gastroenterology

## 2021-05-12 DIAGNOSIS — R1010 Upper abdominal pain, unspecified: Secondary | ICD-10-CM

## 2021-05-12 DIAGNOSIS — R194 Change in bowel habit: Secondary | ICD-10-CM

## 2021-05-13 ENCOUNTER — Other Ambulatory Visit: Payer: Self-pay

## 2021-05-13 ENCOUNTER — Ambulatory Visit
Admission: RE | Admit: 2021-05-13 | Discharge: 2021-05-13 | Disposition: A | Discharge status: Home / Self Care | Payer: BC Managed Care – PPO | Source: Ambulatory Visit | Attending: Gastroenterology | Admitting: Gastroenterology

## 2021-05-13 DIAGNOSIS — R1010 Upper abdominal pain, unspecified: Secondary | ICD-10-CM | POA: Diagnosis not present

## 2021-05-13 DIAGNOSIS — R194 Change in bowel habit: Secondary | ICD-10-CM | POA: Insufficient documentation

## 2021-06-18 LAB — HM COLONOSCOPY

## 2021-06-23 ENCOUNTER — Other Ambulatory Visit: Payer: Self-pay | Admitting: Gastroenterology

## 2021-06-23 DIAGNOSIS — R1011 Right upper quadrant pain: Secondary | ICD-10-CM

## 2021-07-12 ENCOUNTER — Ambulatory Visit
Admission: RE | Admit: 2021-07-12 | Discharge: 2021-07-12 | Disposition: A | Discharge status: Home / Self Care | Payer: BC Managed Care – PPO | Source: Ambulatory Visit | Attending: Gastroenterology | Admitting: Gastroenterology

## 2021-07-12 ENCOUNTER — Other Ambulatory Visit: Payer: Self-pay

## 2021-07-12 DIAGNOSIS — R1011 Right upper quadrant pain: Secondary | ICD-10-CM | POA: Insufficient documentation

## 2021-07-12 MED ORDER — TECHNETIUM TC 99M MEBROFENIN IV KIT
5.0000 | PACK | Freq: Once | INTRAVENOUS | Status: AC
  Administered 2021-07-12: 5.23 via INTRAVENOUS

## 2021-08-25 ENCOUNTER — Other Ambulatory Visit: Payer: Self-pay

## 2021-08-25 ENCOUNTER — Ambulatory Visit: Payer: BC Managed Care – PPO

## 2021-08-25 DIAGNOSIS — Z23 Encounter for immunization: Secondary | ICD-10-CM

## 2021-09-15 ENCOUNTER — Other Ambulatory Visit: Payer: Self-pay | Admitting: Family Medicine

## 2021-09-15 DIAGNOSIS — R1084 Generalized abdominal pain: Secondary | ICD-10-CM

## 2021-10-11 ENCOUNTER — Other Ambulatory Visit: Payer: Self-pay

## 2021-10-11 ENCOUNTER — Ambulatory Visit
Admission: RE | Admit: 2021-10-11 | Discharge: 2021-10-11 | Disposition: A | Discharge status: Home / Self Care | Payer: BC Managed Care – PPO | Source: Ambulatory Visit | Attending: Family Medicine | Admitting: Family Medicine

## 2021-10-11 DIAGNOSIS — R1084 Generalized abdominal pain: Secondary | ICD-10-CM | POA: Insufficient documentation

## 2021-10-11 MED ORDER — IOHEXOL 300 MG/ML  SOLN
100.0000 mL | Freq: Once | INTRAMUSCULAR | Status: AC | PRN
  Administered 2021-10-11: 13:00:00 100 mL via INTRAVENOUS

## 2021-11-02 ENCOUNTER — Other Ambulatory Visit: Payer: Self-pay

## 2021-11-02 ENCOUNTER — Ambulatory Visit: Payer: BC Managed Care – PPO

## 2021-11-02 DIAGNOSIS — Z23 Encounter for immunization: Secondary | ICD-10-CM

## 2021-11-02 NOTE — Progress Notes (Signed)
Order placed for Tdap., Nurse visit.

## 2021-11-02 NOTE — Patient Instructions (Signed)
Tdap (Tetanus, Diphtheria, Pertussis) Vaccine: What You Need to Know 1. Why get vaccinated? Tdap vaccine can prevent tetanus, diphtheria, and pertussis. Diphtheria and pertussis spread from person to person. Tetanus enters the body through cuts or wounds. TETANUS (T) causes painful stiffening of the muscles. Tetanus can lead to serious health problems, including being unable to open the mouth, having trouble swallowing and breathing, or death. DIPHTHERIA (D) can lead to difficulty breathing, heart failure, paralysis, or death. PERTUSSIS (aP), also known as "whooping cough," can cause uncontrollable, violent coughing that makes it hard to breathe, eat, or drink. Pertussis can be extremely serious especially in babies and young children, causing pneumonia, convulsions, brain damage, or death. In teens and adults, it can cause weight loss, loss of bladder control, passing out, and rib fractures from severe coughing. 2. Tdap vaccine Tdap is only for children 7 years and older, adolescents, and adults.  Adolescents should receive a single dose of Tdap, preferably at age 11 or 12 years. Pregnant people should get a dose of Tdap during every pregnancy, preferably during the early part of the third trimester, to help protect the newborn from pertussis. Infants are most at risk for severe, life-threatening complications frompertussis. Adults who have never received Tdap should get a dose of Tdap. Also, adults should receive a booster dose of either Tdap or Td (a different vaccine that protects against tetanus and diphtheria but not pertussis) every 10 years, or after 5 years in the case of a severe or dirty wound or burn. Tdap may be given at the same time as other vaccines. 3. Talk with your health care provider Tell your vaccine provider if the person getting the vaccine: Has had an allergic reaction after a previous dose of any vaccine that protects against tetanus, diphtheria, or pertussis, or has any  severe, life-threatening allergies Has had a coma, decreased level of consciousness, or prolonged seizures within 7 days after a previous dose of any pertussis vaccine (DTP, DTaP, or Tdap) Has seizures or another nervous system problem Has ever had Guillain-Barr Syndrome (also called "GBS") Has had severe pain or swelling after a previous dose of any vaccine that protects against tetanus or diphtheria In some cases, your health care provider may decide to postpone Tdapvaccination until a future visit. People with minor illnesses, such as a cold, may be vaccinated. People who are moderately or severely ill should usually wait until they recover beforegetting Tdap vaccine.  Your health care provider can give you more information. 4. Risks of a vaccine reaction Pain, redness, or swelling where the shot was given, mild fever, headache, feeling tired, and nausea, vomiting, diarrhea, or stomachache sometimes happen after Tdap vaccination. People sometimes faint after medical procedures, including vaccination. Tellyour provider if you feel dizzy or have vision changes or ringing in the ears.  As with any medicine, there is a very remote chance of a vaccine causing asevere allergic reaction, other serious injury, or death. 5. What if there is a serious problem? An allergic reaction could occur after the vaccinated person leaves the clinic. If you see signs of a severe allergic reaction (hives, swelling of the face and throat, difficulty breathing, a fast heartbeat, dizziness, or weakness), call 9-1-1and get the person to the nearest hospital. For other signs that concern you, call your health care provider.  Adverse reactions should be reported to the Vaccine Adverse Event Reporting System (VAERS). Your health care provider will usually file this report, or you can do it yourself. Visit the   VAERS website at www.vaers.hhs.gov or call 1-800-822-7967. VAERS is only for reporting reactions, and VAERS staff  members do not give medical advice. 6. The National Vaccine Injury Compensation Program The National Vaccine Injury Compensation Program (VICP) is a federal program that was created to compensate people who may have been injured by certain vaccines. Claims regarding alleged injury or death due to vaccination have a time limit for filing, which may be as short as two years. Visit the VICP website at www.hrsa.gov/vaccinecompensation or call 1-800-338-2382to learn about the program and about filing a claim. 7. How can I learn more? Ask your health care provider. Call your local or state health department. Visit the website of the Food and Drug Administration (FDA) for vaccine package inserts and additional information at www.fda.gov/vaccines-blood-biologics/vaccines. Contact the Centers for Disease Control and Prevention (CDC): Call 1-800-232-4636 (1-800-CDC-INFO) or Visit CDC's website at www.cdc.gov/vaccines. Vaccine Information Statement Tdap (Tetanus, Diphtheria, Pertussis) Vaccine(06/19/2020) This information is not intended to replace advice given to you by your health care provider. Make sure you discuss any questions you have with your healthcare provider. Document Revised: 07/15/2020 Document Reviewed: 07/15/2020 Elsevier Patient Education  2022 Elsevier Inc.  

## 2022-02-07 ENCOUNTER — Other Ambulatory Visit: Payer: Self-pay | Admitting: Family Medicine

## 2022-02-07 DIAGNOSIS — Z1231 Encounter for screening mammogram for malignant neoplasm of breast: Secondary | ICD-10-CM

## 2022-03-22 ENCOUNTER — Ambulatory Visit
Admission: RE | Admit: 2022-03-22 | Discharge: 2022-03-22 | Disposition: A | Discharge status: Home / Self Care | Payer: BC Managed Care – PPO | Source: Ambulatory Visit | Attending: Family Medicine | Admitting: Family Medicine

## 2022-03-22 DIAGNOSIS — Z1231 Encounter for screening mammogram for malignant neoplasm of breast: Secondary | ICD-10-CM | POA: Diagnosis present

## 2023-05-15 ENCOUNTER — Other Ambulatory Visit: Payer: Self-pay | Admitting: Family Medicine

## 2023-05-15 DIAGNOSIS — Z1231 Encounter for screening mammogram for malignant neoplasm of breast: Secondary | ICD-10-CM

## 2023-05-19 ENCOUNTER — Ambulatory Visit
Admission: RE | Admit: 2023-05-19 | Discharge: 2023-05-19 | Disposition: A | Discharge status: Home / Self Care | Payer: BC Managed Care – PPO | Source: Ambulatory Visit | Attending: Family Medicine | Admitting: Family Medicine

## 2023-05-19 DIAGNOSIS — Z1231 Encounter for screening mammogram for malignant neoplasm of breast: Secondary | ICD-10-CM

## 2023-06-08 ENCOUNTER — Encounter: Payer: Self-pay | Admitting: Adult Health

## 2023-06-08 ENCOUNTER — Ambulatory Visit (INDEPENDENT_AMBULATORY_CARE_PROVIDER_SITE_OTHER): Payer: Self-pay | Admitting: Adult Health

## 2023-06-08 VITALS — BP 123/79 | HR 64 | Temp 97.7°F | Resp 16 | Wt 199.0 lb

## 2023-06-08 DIAGNOSIS — L03011 Cellulitis of right finger: Secondary | ICD-10-CM

## 2023-06-08 MED ORDER — SULFAMETHOXAZOLE-TRIMETHOPRIM 800-160 MG PO TABS: 1.0000 | ORAL_TABLET | Freq: Two times a day (BID) | ORAL | 0 refills | Status: DC

## 2023-06-08 NOTE — Progress Notes (Signed)
[facility] [address] [place], [place] [address]   Office Visit Note  Patient Name: Sandra Reynolds Date of Birth [id number]  Medical Record number [id number]  Date of Service: 06/08/2023  Chief Complaint  Patient presents with   Nail Problem     HPI Pt is here for a sick visit. She reports 5 days ago she was cleaning her bathtub with a sponge.  She was moving fast along the grout line of the tile and something went under the nail of her 5th finger of right hand.  She trimmed her nail and tried to clean it, but now its remains tender, and    Current Medication:  Outpatient Encounter Medications as of 06/08/2023  Medication Sig   Calcium Carbonate-Vitamin D 600-400 MG-UNIT tablet Take by mouth.   levothyroxine (SYNTHROID, LEVOTHROID) 112 MCG tablet Take 112 mcg by mouth daily before breakfast.   rosuvastatin (CRESTOR) 20 MG tablet Take 20 mg by mouth daily.   sulfamethoxazole-trimethoprim (BACTRIM DS) 800-160 MG tablet Take 1 tablet by mouth 2 (two) times daily.   Vitamin D, Ergocalciferol, (DRISDOL) 50000 units CAPS capsule TK ONE C PO ONCE A WEEK   [DISCONTINUED] atovaquone-proguanil (MALARONE) 250-100 MG TABS tablet Take 1 tablet by mouth daily. <   No facility-administered encounter medications on file as of 06/08/2023.      Medical History: No past medical history on file.   Vital Signs: BP 123/79   Pulse 64   Temp 97.7 F (36.5 C) (Tympanic)   Resp 16   Wt 199 lb (90.3 kg)   LMP 06/21/2018 (Approximate)   SpO2 99%   BMI 38.86 kg/m    Review of Systems  Constitutional:  Negative for chills, fatigue and fever.  Musculoskeletal:        Pain in finger    Physical Exam Vitals reviewed.  Constitutional:      Appearance: Normal appearance.  Musculoskeletal:     Comments: Right little finger paronychia type injury. Redness, mild drainage, +tenderness with palpation  Neurological:     Mental Status: She is alert.    Assessment/Plan: 1. Paronychia  of finger of right hand Eposm salt soaks and continue to use topical like neosporin.  If symptoms worsen, can start Bactrim as discussed.  - sulfamethoxazole-trimethoprim (BACTRIM DS) 800-160 MG tablet; Take 1 tablet by mouth 2 (two) times daily.  Dispense: 10 tablet; Refill: 0     General Counseling: Sandra Reynolds verbalizes understanding of the findings of todays visit and agrees with plan of treatment. I have discussed any further diagnostic evaluation that may be needed or ordered today. We also reviewed her medications today. she has been encouraged to call the office with any questions or concerns that should arise related to todays visit.   No orders of the defined types were placed in this encounter.   Meds ordered this encounter  Medications   sulfamethoxazole-trimethoprim (BACTRIM DS) 800-160 MG tablet    Sig: Take 1 tablet by mouth 2 (two) times daily.    Dispense:  10 tablet    Refill:  0    Time spent:15 Minutes    Johnna Acosta AGNP-C Nurse Practitioner

## 2023-10-18 ENCOUNTER — Other Ambulatory Visit: Payer: Self-pay

## 2023-10-19 ENCOUNTER — Ambulatory Visit (INDEPENDENT_AMBULATORY_CARE_PROVIDER_SITE_OTHER): Payer: Self-pay | Admitting: Adult Health

## 2023-10-19 DIAGNOSIS — Z23 Encounter for immunization: Secondary | ICD-10-CM

## 2023-10-19 DIAGNOSIS — E039 Hypothyroidism, unspecified: Secondary | ICD-10-CM

## 2023-10-20 NOTE — Progress Notes (Signed)
Not seen by provider 

## 2023-10-21 LAB — TSH: TSH: 1.77 u[IU]/mL (ref 0.450–4.500)

## 2024-05-27 ENCOUNTER — Other Ambulatory Visit: Payer: Self-pay | Admitting: Family Medicine

## 2024-05-27 DIAGNOSIS — Z1231 Encounter for screening mammogram for malignant neoplasm of breast: Secondary | ICD-10-CM

## 2024-05-29 ENCOUNTER — Ambulatory Visit
Admission: RE | Admit: 2024-05-29 | Discharge: 2024-05-29 | Disposition: A | Discharge status: Home / Self Care | Source: Ambulatory Visit | Attending: Family Medicine | Admitting: Family Medicine

## 2024-05-29 DIAGNOSIS — Z1231 Encounter for screening mammogram for malignant neoplasm of breast: Secondary | ICD-10-CM | POA: Insufficient documentation

## 2024-10-25 ENCOUNTER — Ambulatory Visit: Payer: Self-pay

## 2024-10-25 DIAGNOSIS — K2950 Unspecified chronic gastritis without bleeding: Secondary | ICD-10-CM | POA: Diagnosis present

## 2024-12-19 ENCOUNTER — Encounter: Payer: Self-pay | Admitting: Internal Medicine

## 2024-12-19 ENCOUNTER — Other Ambulatory Visit: Payer: Self-pay

## 2024-12-19 ENCOUNTER — Ambulatory Visit: Admitting: Internal Medicine

## 2024-12-19 VITALS — BP 118/78 | HR 68 | Temp 98.2°F | Resp 16 | Ht 60.0 in | Wt 190.5 lb

## 2024-12-19 DIAGNOSIS — K2950 Unspecified chronic gastritis without bleeding: Secondary | ICD-10-CM | POA: Diagnosis not present

## 2024-12-19 DIAGNOSIS — E039 Hypothyroidism, unspecified: Secondary | ICD-10-CM | POA: Insufficient documentation

## 2024-12-19 DIAGNOSIS — E782 Mixed hyperlipidemia: Secondary | ICD-10-CM | POA: Diagnosis not present

## 2024-12-19 NOTE — Progress Notes (Signed)
 "  New Patient Office Visit  Subjective    Patient ID: Sandra Reynolds, female    DOB: 21-Mar-1976  Age: 49 y.o. MRN: [id number]  CC:  Chief Complaint  Patient presents with   Establish Care    HPI Sandra Reynolds presents to establish care.  Discussed the use of AI scribe software for clinical note transcription with the patient, who gave verbal consent to proceed.  History of Present Illness Sandra Reynolds is a 49 year old female who presents for an annual physical exam.  She has hyperlipidemia with recent labs in June showing slightly elevated LDL and triglycerides and a total cholesterol of 199. She follows a pescatarian diet and avoids sugary drinks and chips. She has a family history of high cholesterol and takes Crestor 20 mg daily.  She was diagnosed with hypothyroidism around 2010 during fertility treatments. Her TSH has been stable in the 1.3 to 1.8 range and she feels well. She exercises about five days a week and takes levothyroxine 88 mcg daily.  She has chronic gastritis with diarrhea that began in 2022 while living in [place], with prior debilitating abdominal pain. A HIDA scan was normal. She improved on rifaximin for presumed SIBO. Two endoscopies were done, with the most recent showing mild gastritis and intestinal metaplasia.  Her family history is notable for bladder cancer in her father and non-Hodgkin's lymphoma in her mother. She has no personal history of cancer. She had a laparoscopic hysterectomy in 2019 for uterine fibroids with ovaries preserved.  She is up to date on flu, tetanus, and COVID-19 vaccinations.   HLD: -Medications: Crestor 20 mg -Patient is compliant with above medications and reports no side effects.  -Last lipid panel: Lipid Panel     Component Value Date/Time   CHOL 185 10/18/2018 0800   TRIG 126 10/18/2018 0800   HDL 32 (L) 10/18/2018 0800   CHOLHDL 5.8 (H) 10/18/2018 0800   LDLCALC 128 (H) 10/18/2018 0800   LABVLDL 25 10/18/2018 0800    Hypothyroidism: -Medications: Levothyroxine 88 mcg -Patient is compliant with the above medication (s) at the above dose and reports no medication side effects.  -Denies weight changes, cold./heat intolerance, skin changes, anxiety/palpitations  -Last TSH: 1.371 6/25  Health Maintenance: -Blood work UTD -Mammogram 7/25 Birads-1 -Colonoscopy 8/22, repeat in 10 years -Hx of total hysterectomy in 2019 -Discussed shingles and Prevnar vaccines  Outpatient Encounter Medications as of 12/19/2024  Medication Sig   Calcium Carbonate-Vitamin D  600-400 MG-UNIT tablet Take by mouth.   levothyroxine (SYNTHROID) 88 MCG tablet Take 88 mcg by mouth daily.   rosuvastatin (CRESTOR) 20 MG tablet Take 20 mg by mouth daily.   levothyroxine (SYNTHROID, LEVOTHROID) 112 MCG tablet Take 112 mcg by mouth daily before breakfast.   sulfamethoxazole -trimethoprim  (BACTRIM  DS) 800-160 MG tablet Take 1 tablet by mouth 2 (two) times daily.   Vitamin D , Ergocalciferol , (DRISDOL) 50000 units CAPS capsule TK ONE C PO ONCE A WEEK   No facility-administered encounter medications on file as of 12/19/2024.    Past Medical History:  Diagnosis Date   Hyperlipidemia    Hypothyroidism     Past Surgical History:  Procedure Laterality Date   ABDOMINAL HYSTERECTOMY      Family History  Problem Relation Age of Onset   Hypertension Mother    Cancer Mother    Cancer Father    Breast cancer Neg Hx     Social History   Socioeconomic History   Marital status: Married    Spouse  name: Not on file   Number of children: Not on file   Years of education: Not on file   Highest education level: Doctorate  Occupational History   Not on file  Tobacco Use   Smoking status: Never   Smokeless tobacco: Never  Vaping Use   Vaping status: Never Used  Substance and Sexual Activity   Alcohol use: Yes   Drug use: No   Sexual activity: Not on file  Other Topics Concern   Not on file  Social History Narrative   Not on file    Social Drivers of Health   Tobacco Use: Low Risk (12/19/2024)   Patient History    Smoking Tobacco Use: Never    Smokeless Tobacco Use: Never    Passive Exposure: Not on file  Financial Resource Strain: Low Risk (12/15/2024)   Overall Financial Resource Strain (CARDIA)    Difficulty of Paying Living Expenses: Not hard at all  Food Insecurity: No Food Insecurity (12/15/2024)   Epic    Worried About Radiation Protection Practitioner of Food in the Last Year: Never true    Ran Out of Food in the Last Year: Never true  Transportation Needs: No Transportation Needs (12/15/2024)   Epic    Lack of Transportation (Medical): No    Lack of Transportation (Non-Medical): No  Physical Activity: Sufficiently Active (12/15/2024)   Exercise Vital Sign    Days of Exercise per Week: 5 days    Minutes of Exercise per Session: 50 min  Stress: No Stress Concern Present (12/15/2024)   [employer] of Occupational Health - Occupational Stress Questionnaire    Feeling of Stress: Only a little  Social Connections: Moderately Integrated (12/15/2024)   Social Connection and Isolation Panel    Frequency of Communication with Friends and Family: More than three times a week    Frequency of Social Gatherings with Friends and Family: Twice a week    Attends Religious Services: Never    Database Administrator or Organizations: Yes    Attends Engineer, Structural: More than 4 times per year    Marital Status: Married  Catering Manager Violence: Not on file  Depression (PHQ2-9): Low Risk (12/19/2024)   Depression (PHQ2-9)    PHQ-2 Score: 0  Alcohol Screen: Low Risk (12/15/2024)   Alcohol Screen    Last Alcohol Screening Score (AUDIT): 4  Housing: Low Risk (12/15/2024)   Epic    Unable to Pay for Housing in the Last Year: No    Number of Times Moved in the Last Year: 0    Homeless in the Last Year: No  Utilities: Not At Risk (05/27/2024)   Received from [facility]   Epic    In the past 12 months has the  electric, gas, oil, or water company threatened to shut off services in your home?: No  Health Literacy: Not on file    Review of Systems  All other systems reviewed and are negative.       Objective    BP 118/78   Pulse 68   Temp 98.2 F (36.8 C) (Oral)   Resp 16   Ht 5' (1.524 m)   Wt 190 lb 8 oz (86.4 kg)   LMP 06/21/2018   SpO2 98%   BMI 37.20 kg/m   Physical Exam Constitutional:      Appearance: Normal appearance.  HENT:     Head: Normocephalic and atraumatic.     Mouth/Throat:     Mouth:  Mucous membranes are moist.     Pharynx: Oropharynx is clear.  Eyes:     Extraocular Movements: Extraocular movements intact.     Conjunctiva/sclera: Conjunctivae normal.     Pupils: Pupils are equal, round, and reactive to light.  Neck:     Comments: No thyromegaly Cardiovascular:     Rate and Rhythm: Normal rate and regular rhythm.  Pulmonary:     Effort: Pulmonary effort is normal.     Breath sounds: Normal breath sounds.  Musculoskeletal:     Cervical back: No tenderness.     Right lower leg: No edema.     Left lower leg: No edema.  Lymphadenopathy:     Cervical: No cervical adenopathy.  Skin:    General: Skin is warm and dry.  Neurological:     General: No focal deficit present.     Mental Status: She is alert. Mental status is at baseline.  Psychiatric:        Mood and Affect: Mood normal.        Behavior: Behavior normal.         Assessment & Plan:   Assessment & Plan Mixed hyperlipidemia LDL and triglycerides slightly elevated. Family history noted. Crestor effective for LDL. Statins ineffective for triglycerides. - Continue Crestor 20 mg daily. - Consider fish oil supplements for triglycerides. - Encouraged omega-3 intake through fish and nuts. - Encouraged clean diet and regular exercise.  Hypothyroidism TSH levels well-managed. TPO antibody testing considered due to family history. - Ordered TSH and TPO antibody tests. - Continue  levothyroxine 88 mcg daily. - Ensure fasting before lab tests.  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 Recent endoscopy showed mild gastritis and intestinal metaplasia, no H. pylori. - Consider probiotic regimen with Align. - Ensure adequate dietary fiber intake. - Maintain hydration. - Use rifaximin during flare-ups only.  General Health Maintenance Discussed shingles and pneumonia vaccines. Shingles vaccine recommended due to age. - Will administer shingles vaccine at pharmacy. - Will consider pneumonia vaccine at clinic. - Ensure hepatitis vaccinations are up to date before travel.  - TSH - Lipid Profile - Comprehensive Metabolic Panel (CMET) - CBC w/Diff/Platelet - Thyroid  peroxidase antibody  Return in about 6 months (around 06/18/2025).   Sharyle Fischer, DO  "

## 2024-12-19 NOTE — Patient Instructions (Addendum)
 It was great seeing you today!  Plan discussed at today's visit: -Blood work ordered today, results will be uploaded to MyChart. Please return fasting any weekday 8-11:30 or 1:30-3:30.    Follow up in: 6 months   Take care and let us  know if you have any questions or concerns prior to your next visit.  Dr. Yulonda Wheeling  Fiber Amounts in Different Foods Fiber is found in plant foods, such as fruits, vegetables, whole grains, nuts, seeds, and beans. If you have certain conditions, you may need to eat a high-fiber diet or a low-fiber diet. Your health care provider will tell you how much fiber you need. If you have problems or questions, contact your provider or dietitian. What foods are high in fiber?  Foods high in fiber have 4g of fiber or more per serving. Fruits Blackberries or raspberries (fresh) --  cup (75 g) has 4 g of fiber. Pear (fresh) -- 1 medium (180 g) has 5.5 g of fiber. Prunes (dried) -- 6 to 8 pieces (57-76 g) has 5 g of fiber. Apple with skin -- 1 medium (182 g) has 4.8 g of fiber. Guava -- 1 cup (128 g) has 8.9 g of fiber. Vegetables Peas (frozen) --  cup (80 g) has 4.4 g of fiber. Potato with skin (baked) -- 1 medium (173 g) has 4 g of fiber. Pumpkin (canned) --  cup (122 g) has 4 g of fiber. Sweet potato --  cup mashed (124 g) has 4 g of fiber. Winter squash -- 1 cup cooked (205 g) has 5.7 g of fiber. Grains Bran cereal --  cup (31 g) has 8.6 g of fiber. Bulgur (cooked) --  cup (70 g) has 4 g of fiber. Quinoa (cooked) -- 1 cup (185 g) has 5.2 g of fiber. Popcorn -- 3 cups (375 g) popped has 5.8 g of fiber. Spaghetti, whole wheat -- 1 cup (140 g) has 6 g of fiber. Oatmeal (cooked) -- 1 cup (234 g) has 4g of fiber. Meats and other proteins Pinto beans (cooked) --  cup (90 g) has 7.7 g of fiber. Lentils (cooked) --  cup (90 g) has 7.8 g of fiber. Kidney beans (canned) --  cup (92.5 g) has 5.7 g of fiber. Soybeans (canned, frozen, or fresh) --  cup (92.5 g)  has 5.2 g of fiber. Baked beans, plain or vegetarian (canned) --  cup (130 g) has 5.2 g of fiber. Garbanzo beans or chickpeas (canned) --  cup (90 g) has 6.6 g of fiber. Black beans (cooked) --  cup (86 g) has 7.5 g of fiber. White beans or navy beans (cooked) --  cup (91 g) has 9.3 g of fiber. The items listed above may not be a complete list of foods with high fiber. Actual amounts of fiber may be different depending on processing. Contact a dietitian for more information. What foods are moderate in fiber?  Moderate fiber foods have 1-3 g of fiber per serving. Fruits Banana -- 1 medium (126 g) has 3.2 g of fiber. Melon -- 1 cup (155 g) has 1.4 g of fiber. Orange -- 1 small (154 g) has 3.7 g of fiber. Raisins --  cup (40 g) has 1.8 g of fiber. Applesauce, sweetened --  cup (125 g) has 1.5 g of fiber. Blueberries (fresh) --  cup (75 g) has 1.8 g of fiber. Strawberries (fresh, sliced) -- 1 cup (150 g) has 3 g of fiber. Cherries -- 1 cup (140 g) has  2.9 g of fiber. Vegetables Broccoli (cooked) --  cup (77.5 g) has 2.1 g of fiber. Brussels sprouts (cooked) --  cup (78 g) has 3 g of fiber. Carrots (cooked) --  cup (77.5 g) has 2.2 g of fiber. Corn (canned or frozen) --  cup (82.5 g) has 2.1 g of fiber. Potatoes, mashed --  cup (105 g) has 1.6 g of fiber. Tomato -- 1 medium (62 g) has 1.5 g of fiber. Green beans (canned) --  cup (83 g) has 2 g of fiber. Sweet potato, baked -- 1 medium (150 g) has 3 g of fiber. Cauliflower (cooked) -- 1/2 cup (90 g) has 2.3 g of fiber. Grains Long-grain brown rice (cooked) -- 1 cup (196 g) has 3.5 g of fiber. Bagel, plain -- one 4-inch (10 cm) bagel has 2 g of fiber. Instant oatmeal --  cup (120 g) has about 2 g of fiber. Macaroni noodles, enriched (cooked) -- 1 cup (140 g) has 2.5 g of fiber. Multigrain cereal --  cup (15 g) has about 2-4 g of fiber. Whole-wheat bread -- 1 slice (26 g) has 2 g of fiber. Whole-wheat spaghetti noodles --   cup (70 g) has 3.2 g of fiber. Corn tortilla -- one 6-inch (15 cm) tortilla has 1.5 g of fiber. Meats and other proteins Almonds --  cup or 1 oz (28 g) has 3.5 g of fiber. Sunflower seeds in shell --  cup or  oz (11.5 g) has 1.1 g of fiber. Vegetable or soy patty -- 1 patty (70 g) has 3.4 g of fiber. Walnuts --  cup or 1 oz (30 g) has 2 g of fiber. Flax seed -- 1 Tbsp (7 g) has 2.8 g of fiber. The items listed above may not be a complete list of foods with moderate amounts of fiber. Actual amounts of fiber may be different depending on processing. Contact a dietitian for more information. What foods are low in fiber?  Low-fiber foods contain less than 1 g of fiber per serving. They include: Fruits Fruit juice --  cup or 4 fl oz (118 mL) has 0.5 g of fiber. Vegetables Lettuce -- 1 cup (35 g) has 0.5 g of fiber. Cucumber (slices) --  cup (60 g) has 0.3 g of fiber. Celery -- 1 stalk (40 g) has 0.1 g of fiber. Grains Flour tortilla -- one 6-inch (15 cm) tortilla has 0.5 g of fiber. White rice (cooked) --  cup (81.5 g) has 0.3 g of fiber. Meats and other proteins Egg -- 1 large (50 g) has 0 g of fiber. Meat, poultry, or fish -- 3 oz (85 g) has 0 g of fiber. Dairy Milk -- 1 cup or 8 fl oz (237 mL) has 0 g of fiber. Yogurt -- 1 cup (245 g) has 0 g of fiber. The items listed above may not be a complete list of foods that are low in fiber. Actual amounts of fiber may be different depending on processing. Contact a dietitian for more information. This information is not intended to replace advice given to you by your health care provider. Make sure you discuss any questions you have with your health care provider. Document Revised: 09/09/2024 Document Reviewed: 01/23/2023 Elsevier Patient Education  2025 [employer]  Pneumococcal Conjugate Vaccine (PCV20) Injection What is this medication? PNEUMOCOCCAL CONJUGATE VACCINE (NEU mo KOK al kon ju gate vak SEEN) reduces the risk of  pneumococcal disease, such as pneumonia. It does not treat pneumococcal disease.  It is still possible to get pneumococcal disease after receiving this vaccine, but the symptoms may be less severe or not last as long. It works by helping your immune system learn how to fight off a future infection. This medicine may be used for other purposes; ask your health care provider or pharmacist if you have questions. COMMON BRAND NAME(S): Prevnar 20 What should I tell my care team before I take this medication? They need to know if you have any of these conditions: Bleeding disorder Fever Immune system problems An unusual or allergic reaction to pneumococcal vaccine, diphtheria toxoid, other vaccines, other medications, foods, dyes, or preservatives Pregnant or trying to get pregnant Breastfeeding How should I use this medication? This vaccine is injected into a muscle. It is given by your care team. A copy of Vaccine Information Statements will be given before each vaccination. Be sure to read this information carefully each time. This sheet may change often. Talk to your care team about the use of this medication in children. While it may be given to children as young as 6 weeks for selected conditions, precautions do apply. Overdosage: If you think you have taken too much of this medicine contact a poison control center or emergency room at once. NOTE: This medicine is only for you. Do not share this medicine with others. What if I miss a dose? This does not apply. This medication is not for regular use. What may interact with this medication? Medications for cancer chemotherapy Medications that suppress your immune function Steroid medications, such as prednisone or cortisone This list may not describe all possible interactions. Give your health care provider a list of all the medicines, herbs, non-prescription drugs, or dietary supplements you use. Also tell them if you smoke, drink alcohol, or  use illegal drugs. Some items may interact with your medicine. What should I watch for while using this medication? Visit your care team regularly. Report any side effects to your care team right away. This vaccine, like all vaccines, may not fully protect everyone. What side effects may I notice from receiving this medication? Side effects that you should report to your care team as soon as possible: Allergic reactions--skin rash, itching, hives, swelling of the face, lips, tongue, or throat Side effects that usually do not require medical attention (report these to your care team if they continue or are bothersome): Fatigue Fever Headache Joint pain Muscle pain Pain, redness, or irritation at injection site This list may not describe all possible side effects. Call your doctor for medical advice about side effects. You may report side effects to FDA at 1-800-FDA-1088. Where should I keep my medication? This vaccine is only given by your care team. It will not be stored at home. NOTE: This sheet is a summary. It may not cover all possible information. If you have questions about this medicine, talk to your doctor, pharmacist, or health care provider.  2024 [employer]vier/Gold Standard (2022-04-13 00:00:00)

## 2025-06-19 ENCOUNTER — Ambulatory Visit: Admitting: Internal Medicine
